# Patient Record
Sex: Female | Born: 2005
Health system: Southern US, Community
[De-identification: ages and names within clinical notes are randomized; demographics above are authoritative.]

## PROBLEM LIST (undated history)

## (undated) DIAGNOSIS — F909 Attention-deficit hyperactivity disorder, unspecified type: Secondary | ICD-10-CM

## (undated) HISTORY — PX: EAR TUBE REMOVAL: SHX1486

## (undated) HISTORY — DX: Attention-deficit hyperactivity disorder, unspecified type: F90.9

---
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
## 2024-02-24 ENCOUNTER — Encounter: Payer: Self-pay | Admitting: Nurse Practitioner

## 2024-02-24 ENCOUNTER — Ambulatory Visit: Admitting: Nurse Practitioner

## 2024-02-24 VITALS — BP 112/72 | HR 96 | Temp 98.5°F | Ht 64.02 in | Wt 147.0 lb

## 2024-02-24 DIAGNOSIS — F902 Attention-deficit hyperactivity disorder, combined type: Secondary | ICD-10-CM | POA: Diagnosis not present

## 2024-02-24 DIAGNOSIS — J069 Acute upper respiratory infection, unspecified: Secondary | ICD-10-CM

## 2024-02-24 DIAGNOSIS — J029 Acute pharyngitis, unspecified: Secondary | ICD-10-CM

## 2024-02-24 DIAGNOSIS — Z30011 Encounter for initial prescription of contraceptive pills: Secondary | ICD-10-CM | POA: Diagnosis not present

## 2024-02-24 DIAGNOSIS — K580 Irritable bowel syndrome with diarrhea: Secondary | ICD-10-CM | POA: Diagnosis not present

## 2024-02-24 LAB — POCT URINE PREGNANCY: Preg Test, Ur: NEGATIVE

## 2024-02-24 LAB — POCT RAPID STREP A (OFFICE): Rapid Strep A Screen: NEGATIVE

## 2024-02-24 MED ORDER — LO LOESTRIN FE 1 MG-10 MCG / 10 MCG PO TABS: 1.0000 | ORAL_TABLET | Freq: Every day | ORAL | 5 refills | Status: DC

## 2024-02-24 MED ORDER — DICYCLOMINE HCL 10 MG PO CAPS: 10.0000 mg | ORAL_CAPSULE | Freq: Three times a day (TID) | ORAL | 0 refills | Status: DC

## 2024-02-24 NOTE — Patient Instructions (Signed)
 Diet for Irritable Bowel Syndrome When you have irritable bowel syndrome (IBS), it is very important to follow the eating habits that are best for your condition. IBS may cause various symptoms, such as pain in the abdomen, constipation, or diarrhea. Choosing the right foods can help to ease the discomfort from these symptoms. Work with your health care provider and dietitian to find the eating plan that will help to control your symptoms. What are tips for following this plan?  Keep a food diary. This will help you identify foods that cause symptoms. Write down: What you eat and when you eat it. What symptoms you have. When symptoms occur in relation to your meals, such as "pain in abdomen 2 hours after dinner." Eat your meals slowly and in a relaxed setting. Aim to eat 5-6 small meals per day. Do not skip meals. Drink enough fluid to keep your urine pale yellow. Ask your health care provider if you should take an over-the-counter probiotic to help restore healthy bacteria in your gut (digestive tract). Probiotics are foods that contain good bacteria and yeasts. Your dietitian may have specific dietary recommendations for you based on your symptoms. Your dietitian may recommend that you: Avoid foods that cause symptoms. Talk with your dietitian about other ways to get the same nutrients that are in those problem foods. Avoid foods with gluten. Gluten is a protein that is found in rye, wheat, and barley. Eat more foods that contain soluble fiber. Examples of foods with high soluble fiber include oats, seeds, and certain fruits and vegetables. Take a fiber supplement if told by your dietitian. Reduce or avoid certain foods called FODMAPs. These are foods that contain sugars that are hard for some people to digest. Ask your health care provider which foods to avoid. What foods should I avoid? The following are some foods and drinks that may make your symptoms worse: Fatty foods, such as french  fries. Foods that contain gluten, such as pasta and cereal. Dairy products, such as milk, cheese, and ice cream. Spicy foods. Alcohol. Products with caffeine, such as coffee, tea, or chocolate. Carbonated drinks, such as soda. Foods that are high in FODMAPs. These include certain fruits and vegetables. Products with sweeteners such as honey, high fructose corn syrup, sorbitol, and mannitol. The items listed above may not be a complete list of foods and beverages you should avoid. Contact a dietitian for more information. What foods are good sources of fiber? Your health care provider or dietitian may recommend that you eat more foods that contain fiber. Fiber can help to reduce constipation and other IBS symptoms. Add foods with fiber to your diet a little at a time so your body can get used to them. Too much fiber at one time might cause gas and swelling of your abdomen. The following are some foods that are good sources of fiber: Berries, such as raspberries, strawberries, and blueberries. Tomatoes. Carrots. Brown rice. Oats. Seeds, such as chia and pumpkin seeds. The items listed above may not be a complete list of recommended sources of fiber. Contact your dietitian for more options. Where to find more information International Foundation for Functional Gastrointestinal Disorders: aboutibs.[employer] of Diabetes and Digestive and Kidney Diseases: StageSync.si Summary When you have irritable bowel syndrome (IBS), it is very important to follow the eating habits that are best for your condition. IBS may cause various symptoms, such as pain in the abdomen, constipation, or diarrhea. Choosing the right foods can help to ease the  discomfort that comes from symptoms. Your health care provider or dietitian may recommend that you eat more foods that contain fiber. Keep a food diary. This will help you identify foods that cause symptoms. This information is not intended to replace  advice given to you by your health care provider. Make sure you discuss any questions you have with your health care provider. Document Revised: 08/04/2021 Document Reviewed: 08/04/2021 Elsevier Patient Education  2024 [employer].

## 2024-02-24 NOTE — Progress Notes (Signed)
 Subjective:    Patient ID: Natalie Norris, female    DOB: September 05, 2006, 18 y.o.   MRN: [id number]  HPI Presents to discuss her menstrual cycles.  Cycles are regular, heavy for the first 2 days.  Occasional cramping.  Has never been sexually active.  Will be going to college at [employer] this fall.  Just graduated from high school.  Has held off on taking any hormones or oral contraceptives due to concerns about weight gain.  Patient has been off of her ADD medicines for a while, has been doing well.  In addition patient began having a sore throat 4 days ago.  This occurred after she came back from her trip from the beach with friends.  States all of them have been sick as well.  Has developed a dry cough.  No fever.  No rash.  No ear pain.  Taking fluids well.  Voiding normal limit. Also mention she has a chronic history of diarrhea/loose stools.  Has been going on since she was young.  Stress seems to be one of the triggers.   Review of Systems  Constitutional:  Negative for fever.  HENT:  Positive for congestion and sore throat. Negative for ear pain.   Respiratory:  Positive for cough. Negative for chest tightness, shortness of breath and wheezing.   Cardiovascular:  Negative for chest pain.  Gastrointestinal:  Positive for abdominal pain and diarrhea. Negative for blood in stool, constipation, nausea and vomiting.  Genitourinary:  Positive for menstrual problem.      02/24/2024   11:32 AM  Depression screen PHQ 2/9  Decreased Interest 0  Down, Depressed, Hopeless 0  PHQ - 2 Score 0  Altered sleeping 0  Tired, decreased energy 0  Change in appetite 0  Feeling bad or failure about yourself  0  Trouble concentrating 0  Moving slowly or fidgety/restless 0  Suicidal thoughts 0  PHQ-9 Score 0      02/24/2024   11:33 AM 07/28/2023    4:00 PM 04/28/2023    9:44 AM 02/10/2018    2:28 PM  GAD 7 : Generalized Anxiety Score  Nervous, Anxious, on Edge 0 0 0 2  Control/stop worrying 0 0 0 1   Worry too much - different things 0 0 0 2  Trouble relaxing 0 0 0 1  Restless 0 0 0 0  Easily annoyed or irritable 0 0 0 0  Afraid - awful might happen 0 0 0 0  Total GAD 7 Score 0 0 0 6  Anxiety Difficulty  Not difficult at all Not difficult at all Somewhat difficult    Social History   Tobacco Use   Smoking status: Never   Smokeless tobacco: Never  Vaping Use   Vaping status: Never Used  Substance Use Topics   Alcohol use: Never   Drug use: Never        Objective:   Physical Exam NAD.  Alert, oriented.  Left TM retracted, no erythema.  Right TM clear effusion noted.  Pharynx mildly injected.  Mucous membranes moist.  Neck supple with mild soft anterior cervical adenopathy.  Occasional nonproductive cough noted.  Lungs clear.  No tachypnea.  Heart regular rate rhythm.  Abdomen soft nondistended nontender.  No obvious masses. Today's Vitals   02/24/24 0937  BP: 112/72  Pulse: 96  Temp: 98.5 F (36.9 C)  SpO2: 98%  Weight: 147 lb (66.7 kg)  Height: 5' 4.02 (1.626 m)   Body mass index is  25.22 kg/m.  Results for orders placed or performed in visit on 02/24/24  POCT urine pregnancy   Collection Time: 02/24/24  9:53 AM  Result Value Ref Range   Preg Test, Ur Negative Negative  POCT rapid strep A   Collection Time: 02/24/24  9:53 AM  Result Value Ref Range   Rapid Strep A Screen Negative Negative        Assessment & Plan:   Problem List Items Addressed This Visit       Respiratory   Pharyngitis   Relevant Orders   Culture, Group A Strep     Digestive   Irritable bowel syndrome with diarrhea   Relevant Medications   dicyclomine (BENTYL) 10 MG capsule     Other   Attention deficit hyperactivity disorder (ADHD), combined type   Other Visit Diagnoses       Encounter for initial prescription of contraceptive pills    -  Primary   Relevant Orders   POCT urine pregnancy (Completed)   POCT rapid strep A (Completed)     Viral URI with cough        Relevant Orders   Culture, Group A Strep      Meds ordered this encounter  Medications   Norethindrone-Ethinyl Estradiol-Fe Biphas (LO LOESTRIN FE ) 1 MG-10 MCG / 10 MCG tablet    Sig: Take 1 tablet by mouth daily. Start first Sunday after next cycle.    Dispense:  28 tablet    Refill:  5    Supervising Provider:   Charlotta Cook A [[id number]]   dicyclomine (BENTYL) 10 MG capsule    Sig: Take 1 capsule (10 mg total) by mouth 4 (four) times daily -  before meals and at bedtime. As needed for abdominal cramping    Dispense:  30 capsule    Refill:  0    Supervising Provider:   Charlotta Cook A [[id number]]   Discussed potential risk and benefits of oral contraceptive use. Given written and verbal information on IBS diarrhea.  Prescribed Bentyl to take as directed as needed for abdominal cramping. Defers medication for ADD at this time.  Will wait to see how things are going at college and if she is struggling due to her ADD to let us  know so we can restart medication. Throat culture pending.  Reviewed symptomatic care and warning signs for viral infection. Call back if symptoms worsen or persist.

## 2024-02-27 ENCOUNTER — Ambulatory Visit: Payer: Self-pay | Admitting: Nurse Practitioner

## 2024-02-27 LAB — SPECIMEN STATUS REPORT

## 2024-02-27 LAB — CULTURE, GROUP A STREP: Strep A Culture: NEGATIVE

## 2024-05-14 DIAGNOSIS — S60151A Contusion of right little finger with damage to nail, initial encounter: Secondary | ICD-10-CM | POA: Diagnosis not present

## 2024-06-15 ENCOUNTER — Telehealth: Payer: Self-pay | Admitting: *Deleted

## 2024-06-15 NOTE — Telephone Encounter (Signed)
 Source  Veterinary surgeon, [name] (Patient)   Subject  Natalie Norris, Natalie Norris (Patient)   Topic  Clinical - Medication Question    Communication  Reason for CRM: Patient calling, requesting a return call regarding restarting medication for ADHD, Adderall  10mg .        Patient requesting a return call to discuss further, she is away at college, and would like a return call.        Can be reached at [phone].        Patient is aware of same day call back.            [facility] [phone] - [place], [place] - [address] AT [facility] [address]    [address] [place] [address]    Phone: [phone] Fax: [phone]

## 2024-06-18 ENCOUNTER — Telehealth: Payer: Self-pay

## 2024-06-18 ENCOUNTER — Other Ambulatory Visit: Payer: Self-pay | Admitting: Family Medicine

## 2024-06-18 NOTE — Telephone Encounter (Signed)
 Nurses Please talk with patient It is not unusual for individuals who have ADD to exhibit need for the medication in college It has been a period of time since she has been on it We would need to do a video visit to reestablish medication with myself, Natalie Norris, or Natalie Norris to reestablish or a in person visit-may use same-day slot-if there is any type of urgent situation to all of this please let me know Appreciate-thanks-Dr. Glendia

## 2024-06-18 NOTE — Telephone Encounter (Signed)
 Copied from CRM [id number]. Topic: Clinical - Medication Question >> Jun 18, 2024  2:48 PM Natalie Norris wrote: Reason for CRM:  Patient calling, requesting a return call regarding restarting medication for ADHD, Adderall  10mg . Can be reached at [phone].

## 2024-06-20 NOTE — Telephone Encounter (Signed)
 See previous message- Patient needs office visit to restart medication(controlled substance)

## 2024-06-21 ENCOUNTER — Encounter: Payer: Self-pay | Admitting: *Deleted

## 2024-06-21 NOTE — Telephone Encounter (Signed)
 Patient notified via mychart

## 2024-07-02 ENCOUNTER — Telehealth: Admitting: Nurse Practitioner

## 2024-07-02 DIAGNOSIS — F988 Other specified behavioral and emotional disorders with onset usually occurring in childhood and adolescence: Secondary | ICD-10-CM

## 2024-07-02 MED ORDER — AMPHETAMINE-DEXTROAMPHET ER 20 MG PO CP24: 20.0000 mg | ORAL_CAPSULE | ORAL | 0 refills | Status: DC

## 2024-07-02 NOTE — Progress Notes (Unsigned)
 Virtual Visit via Video Note  I connected with Natalie Norris on 07/02/24 at  3:10 PM EDT by a video enabled telemedicine application and verified that I am speaking with the correct person using two identifiers.  Location: Patient: home Provider: office   I discussed the limitations of evaluation and management by telemedicine and the availability of in person appointments. The patient expressed understanding and agreed to proceed.  History of Present Illness: Presents to discuss restarting her ADD medication. Was on medication up until high school. Her doses of Adderall  over time went up to 30 mg. Her last dosing was 5 mg which she felt was not enough. Having difficulty focusing and completing tasks especially with demands of college including studying and testing. Denies any issues taking Adderall  in the past.    Observations/Objective: Alert, oriented. Calm, cheerful affect. Speech clear. Making good eye contact.   Assessment and Plan: 1. 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 Reviewed potential side effects. DC med and contact office if any problems. Cautioned about taking medication too late in the day.  Will start with one month Rx. Patient to send message about medication and dosing before further refills.  - amphetamine -dextroamphetamine  (ADDERALL  XR) 20 MG 24 hr capsule; Take 1 capsule (20 mg total) by mouth every morning.  Dispense: 30 capsule; Refill: 0   Follow Up Instructions: Return in about 3 months (around 10/02/2024).    I discussed the assessment and treatment plan with the patient. The patient was provided an opportunity to ask questions and all were answered. The patient agreed with the plan and demonstrated an understanding of the instructions.   The patient was advised to call back or seek an in-person evaluation if the symptoms worsen or if the condition fails to improve as anticipated.  I provided  15 minutes of non-face-to-face time during this encounter.   Elveria JAYSON Quarry, NP

## 2024-07-03 ENCOUNTER — Encounter: Payer: Self-pay | Admitting: Nurse Practitioner

## 2024-07-03 ENCOUNTER — Other Ambulatory Visit (HOSPITAL_BASED_OUTPATIENT_CLINIC_OR_DEPARTMENT_OTHER): Payer: Self-pay

## 2024-07-03 ENCOUNTER — Ambulatory Visit: Payer: Self-pay

## 2024-07-03 NOTE — Telephone Encounter (Signed)
 FYI Only or Action Required?: Action required by provider: clinical question for provider.  Patient was last seen in primary care on 07/02/2024 by Mauro Elveria BROCKS, NP.  Called Nurse Triage reporting Medication Problem.  Triage Disposition: Call PCP When Office is Open  Patient/caregiver understands and will follow disposition?: Yes      Copied from CRM [id number]. Topic: Clinical - Medication Question >> Jul 03, 2024  9:07 AM Olam RAMAN wrote: Reason for CRM: Pt is taking  Adderall  20mg  needed to start on 10 mg, 20 mg is too strong for pt seems she gets rapid heart beats and is at college right now. mom Rosina LEAF [phone]      Reason for Disposition  [1] Caller has NON-URGENT medicine question about med that PCP prescribed AND [2] triager unable to answer question  Answer Assessment - Initial Assessment Questions Patient believes that 20mg  of Adderall  is too strong and would like it reduced to 15mg  if possible. Please advise.      1. NAME of MEDICINE: What medicine(s) are you calling about?     Adderall  20mg  2. QUESTION: What is your question? (e.g., double dose of medicine, side effect)     Would like the dose changed to 15mg  if possible 3. PRESCRIBER: Who prescribed the medicine? Reason: if prescribed by specialist, call should be referred to that group.     Dr. Alphonsa  4. SYMPTOMS: Do you have any symptoms? If Yes, ask: What symptoms are you having?  How bad are the symptoms (e.g., mild, moderate, severe)     Patient denies any symptoms  Protocols used: Medication Question Call-A-AH

## 2024-08-17 ENCOUNTER — Other Ambulatory Visit: Payer: Self-pay | Admitting: Family Medicine

## 2024-08-17 ENCOUNTER — Encounter: Payer: Self-pay | Admitting: Family Medicine

## 2024-08-17 MED ORDER — LO LOESTRIN FE 1 MG-10 MCG / 10 MCG PO TABS: 1.0000 | ORAL_TABLET | Freq: Every day | ORAL | 5 refills | Status: AC

## 2024-08-17 NOTE — Telephone Encounter (Signed)
 Copied from CRM #[id number]. Topic: Clinical - Medication Refill >> Aug 17, 2024 12:04 PM Nathanel BROCKS wrote: Medication:Norethindrone-Ethinyl Estradiol-Fe Biphas (LO LOESTRIN FE ) 1 MG-10 MCG / 10 MCG tablet    Has the patient contacted their pharmacy? No   This is the patient's preferred pharmacy:    [facility] - [place], [place] - [address] [place] [address] Phone: [phone] Fax: [phone]  Is this the correct pharmacy for this prescription? Yes If no, delete pharmacy and type the correct one.   Has the prescription been filled recently? Yes  Is the patient out of the medication? Yes  Has the patient been seen for an appointment in the last year OR does the patient have an upcoming appointment? Yes  Can we respond through MyChart? Yes  Agent: Please be advised that Rx refills may take up to 3 business days. We ask that you follow-up with your pharmacy.

## 2024-08-24 ENCOUNTER — Ambulatory Visit: Admitting: Family Medicine

## 2024-09-20 ENCOUNTER — Ambulatory Visit: Payer: Self-pay | Admitting: Family Medicine

## 2024-09-20 ENCOUNTER — Telehealth: Payer: Self-pay | Admitting: Family Medicine

## 2024-09-20 NOTE — Telephone Encounter (Signed)
 FYI Only or Action Required?: Action required by provider: medication refill request, clinical question for provider, and update on patient condition.  Patient was last seen in primary care on 07/02/2024 by Mauro Elveria BROCKS, NP.  Called Nurse Triage reporting Medication Problem.   Triage Disposition: Call PCP When Office is Open  Patient/caregiver understands and will follow disposition?: Yes     Copied from CRM [id number]. Topic: Clinical - Medication Refill >> Sep 20, 2024  3:29 PM Darshell M wrote: Medication:  PATIENT REQUESTING LOWER DOSAGE OF 15MG  INSTEAD OF 20MG      amphetamine -dextroamphetamine  (ADDERALL  XR) 20 MG 24 hr capsule     Reason for Disposition  [1] Caller has NON-URGENT medicine question about med that PCP prescribed AND [2] triager unable to answer question  Answer Assessment - Initial Assessment Questions 1. NAME of MEDICINE: What medicine(s) are you calling about?   Returned call to pt to f/u on med request. Pt states she feels rx is too strong of dose. Denies any vision change, no h/a. NO distress. Would like to decrease dose at this time.  Protocols used: Medication Question Call-A-AH

## 2024-09-20 NOTE — Telephone Encounter (Signed)
 Copied from CRM [id number]. Topic: Clinical - Medication Refill >> Sep 20, 2024  3:29 PM Darshell M wrote: Medication:  PATIENT REQUESTING LOWER DOSAGE OF 15MG  INSTEAD OF 20MG    amphetamine -dextroamphetamine  (ADDERALL  XR) 20 MG 24 hr capsule   Has the patient contacted their pharmacy? Yes (Agent: If no, request that the patient contact the pharmacy for the refill. If patient does not wish to contact the pharmacy document the reason why and proceed with request.) (Agent: If yes, when and what did the pharmacy advise?)  This is the patient's preferred pharmacy:  [facility] - [place], [place] - [address] [place] [address] Phone: [phone] Fax: [phone]  Is this the correct pharmacy for this prescription? Yes If no, delete pharmacy and type the correct one.   Has the prescription been filled recently? No  Is the patient out of the medication? Yes  Has the patient been seen for an appointment in the last year OR does the patient have an upcoming appointment? Yes  Can we respond through MyChart? Yes  Agent: Please be advised that Rx refills may take up to 3 business days. We ask that you follow-up with your pharmacy.

## 2024-09-21 ENCOUNTER — Encounter: Payer: Self-pay | Admitting: Nurse Practitioner

## 2024-09-21 ENCOUNTER — Other Ambulatory Visit: Payer: Self-pay | Admitting: Nurse Practitioner

## 2024-09-21 MED ORDER — AMPHETAMINE-DEXTROAMPHET ER 15 MG PO CP24: 15.0000 mg | ORAL_CAPSULE | ORAL | 0 refills | Status: AC

## 2024-09-21 NOTE — Telephone Encounter (Signed)
 Done

## 2024-09-21 NOTE — Telephone Encounter (Signed)
 Mychart message sent.
